# Patient Record
Sex: Female | Born: 1945 | Race: White | Hispanic: No | Marital: Married | Smoking: Former smoker
Health system: Southern US, Community
[De-identification: ages and names within clinical notes are randomized; demographics above are authoritative.]

## PROBLEM LIST (undated history)

## (undated) DIAGNOSIS — K219 Gastro-esophageal reflux disease without esophagitis: Secondary | ICD-10-CM

## (undated) DIAGNOSIS — I6529 Occlusion and stenosis of unspecified carotid artery: Secondary | ICD-10-CM

## (undated) DIAGNOSIS — I10 Essential (primary) hypertension: Secondary | ICD-10-CM

## (undated) HISTORY — PX: TONSILLECTOMY: SUR1361

## (undated) HISTORY — DX: Occlusion and stenosis of unspecified carotid artery: I65.29

## (undated) HISTORY — PX: TUBAL LIGATION: SHX77

## (undated) HISTORY — DX: Gastro-esophageal reflux disease without esophagitis: K21.9

## (undated) HISTORY — DX: Essential (primary) hypertension: I10

---

## 2009-06-23 ENCOUNTER — Ambulatory Visit: Payer: Self-pay | Admitting: Vascular Surgery

## 2009-06-28 ENCOUNTER — Ambulatory Visit: Payer: Self-pay | Admitting: Vascular Surgery

## 2009-07-07 ENCOUNTER — Ambulatory Visit (HOSPITAL_COMMUNITY): Admission: RE | Admit: 2009-07-07 | Discharge: 2009-07-07 | Payer: Self-pay | Admitting: Vascular Surgery

## 2009-07-20 ENCOUNTER — Ambulatory Visit: Payer: Self-pay | Admitting: Vascular Surgery

## 2009-07-21 ENCOUNTER — Encounter: Payer: Self-pay | Admitting: Vascular Surgery

## 2009-07-21 ENCOUNTER — Inpatient Hospital Stay (HOSPITAL_COMMUNITY): Admission: RE | Admit: 2009-07-21 | Discharge: 2009-07-24 | Payer: Self-pay | Admitting: Vascular Surgery

## 2009-07-21 HISTORY — PX: CAROTID ENDARTERECTOMY: SUR193

## 2009-08-10 ENCOUNTER — Ambulatory Visit: Payer: Self-pay | Admitting: Vascular Surgery

## 2009-09-15 ENCOUNTER — Ambulatory Visit: Payer: Self-pay | Admitting: Vascular Surgery

## 2010-02-22 ENCOUNTER — Ambulatory Visit: Payer: Self-pay | Admitting: Vascular Surgery

## 2010-09-15 ENCOUNTER — Ambulatory Visit: Payer: Self-pay | Admitting: Surgery

## 2010-10-03 IMAGING — CR DG CHEST 2V
2 series · 2 of 2 positions shown · non-contrast
Comparison: None

CLINICAL DATA: Preoperative evaluation for right internal carotid
artery stenosis.  Chest and head congestion and hypertension.
Shortness of breath and chest pain.  Smoking history of [DATE] joule
one pack per day.

CHEST - 2 VIEW

[view not recorded (1 of 2)]
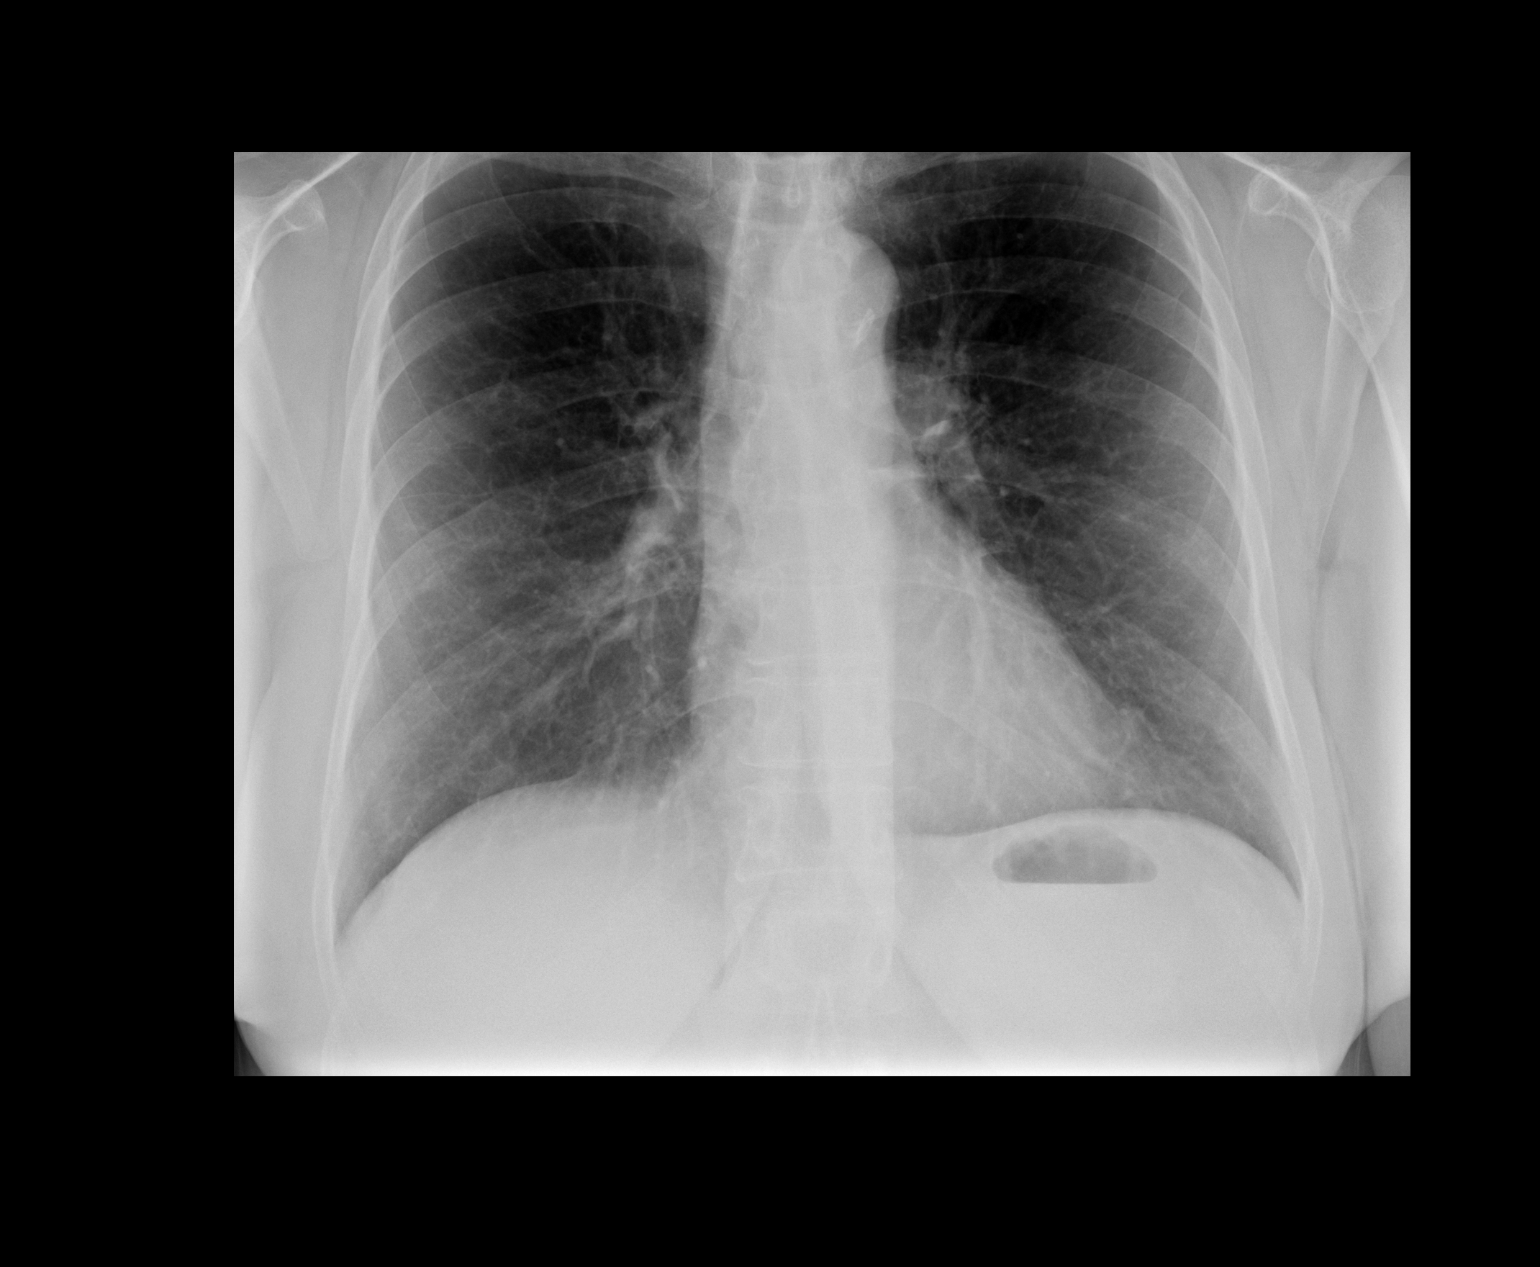

[view not recorded (2 of 2)]
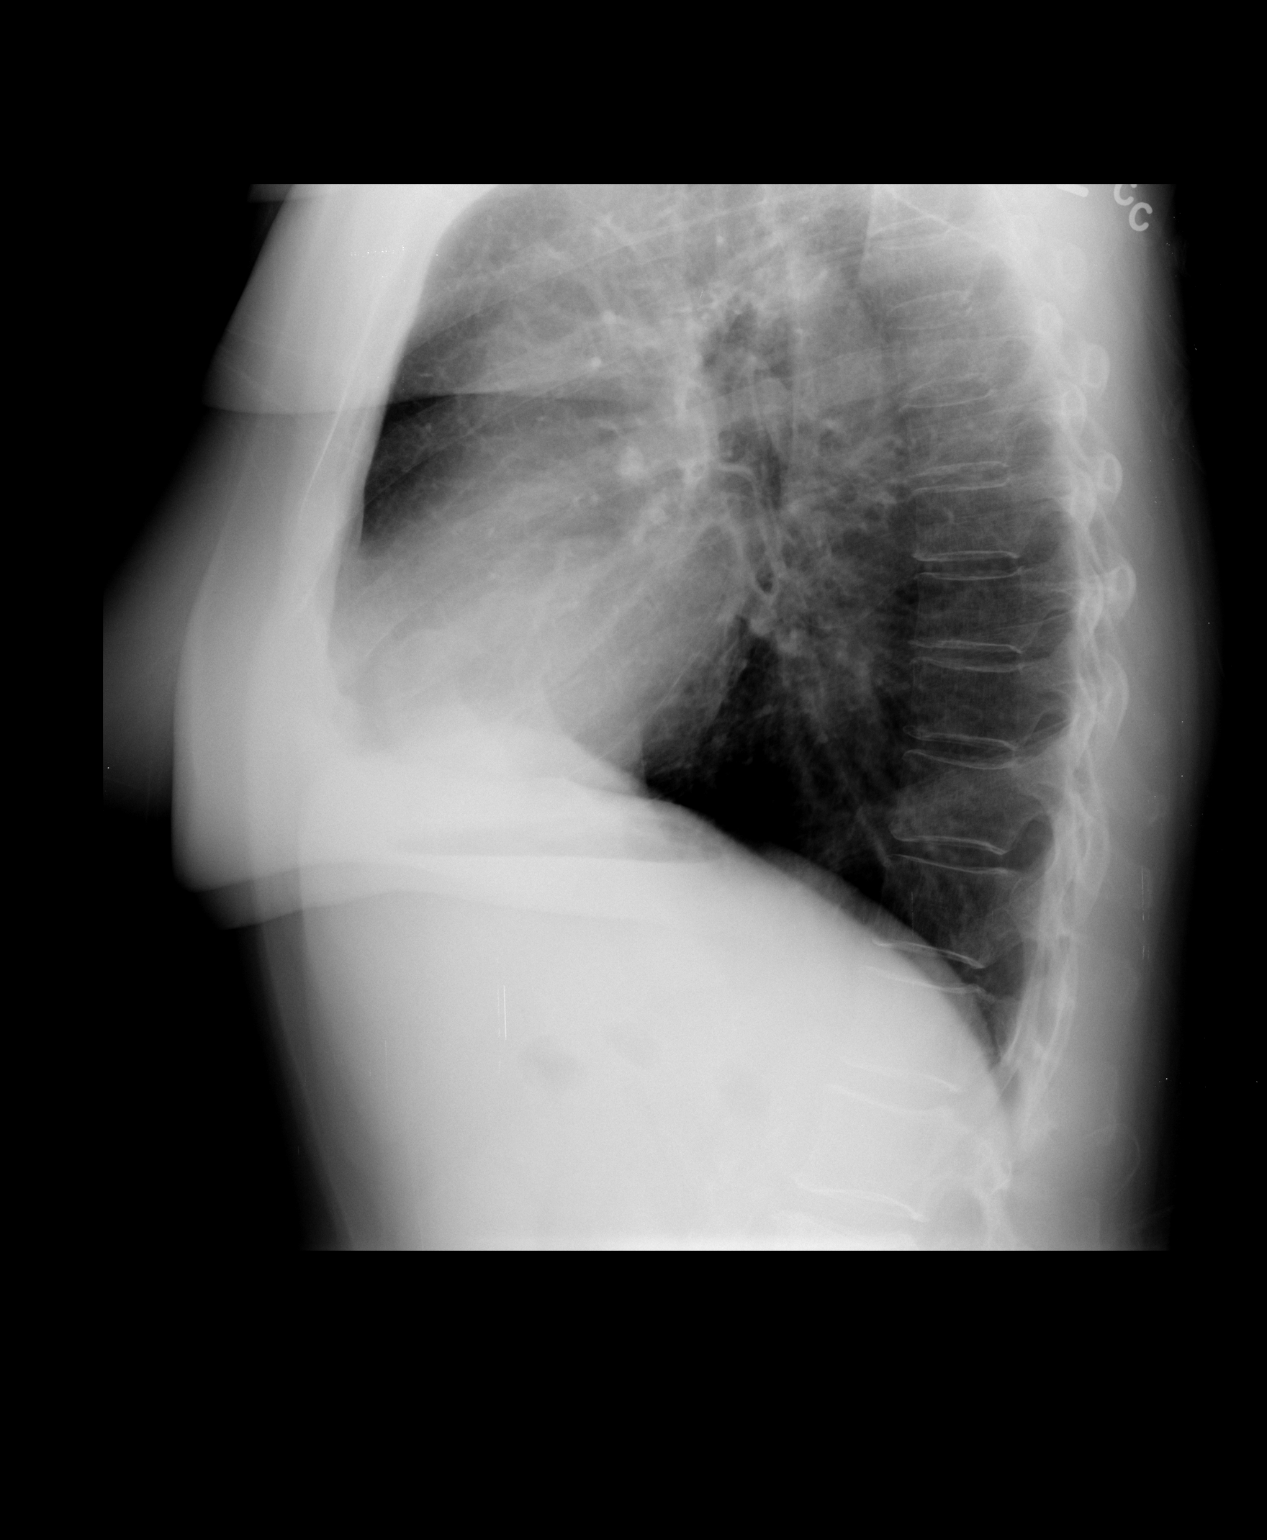

[2 of 2 positions shown; findings below may reference images not displayed]

FINDINGS: Heart and mediastinal contours are within normal limits.
Some diffuse prominence of the interstitial markings are noted
compatible with the patient's history of smoking.  No focal
infiltrates or signs of congestive failure seen.  No pleural fluid
is noted.

There is a small amount of calcification noted in the aortic arch.
Bony structures are intact.
IMPRESSION: No acute cardiopulmonary abnormality noted.

## 2010-10-20 IMAGING — CR DG CHEST 2V
2 series · 2 of 2 positions shown · non-contrast
Comparison: 07/07/2009

CLINICAL DATA: Congestion.  Right ICA stenosis.

CHEST - 2 VIEW

[w chest pa]
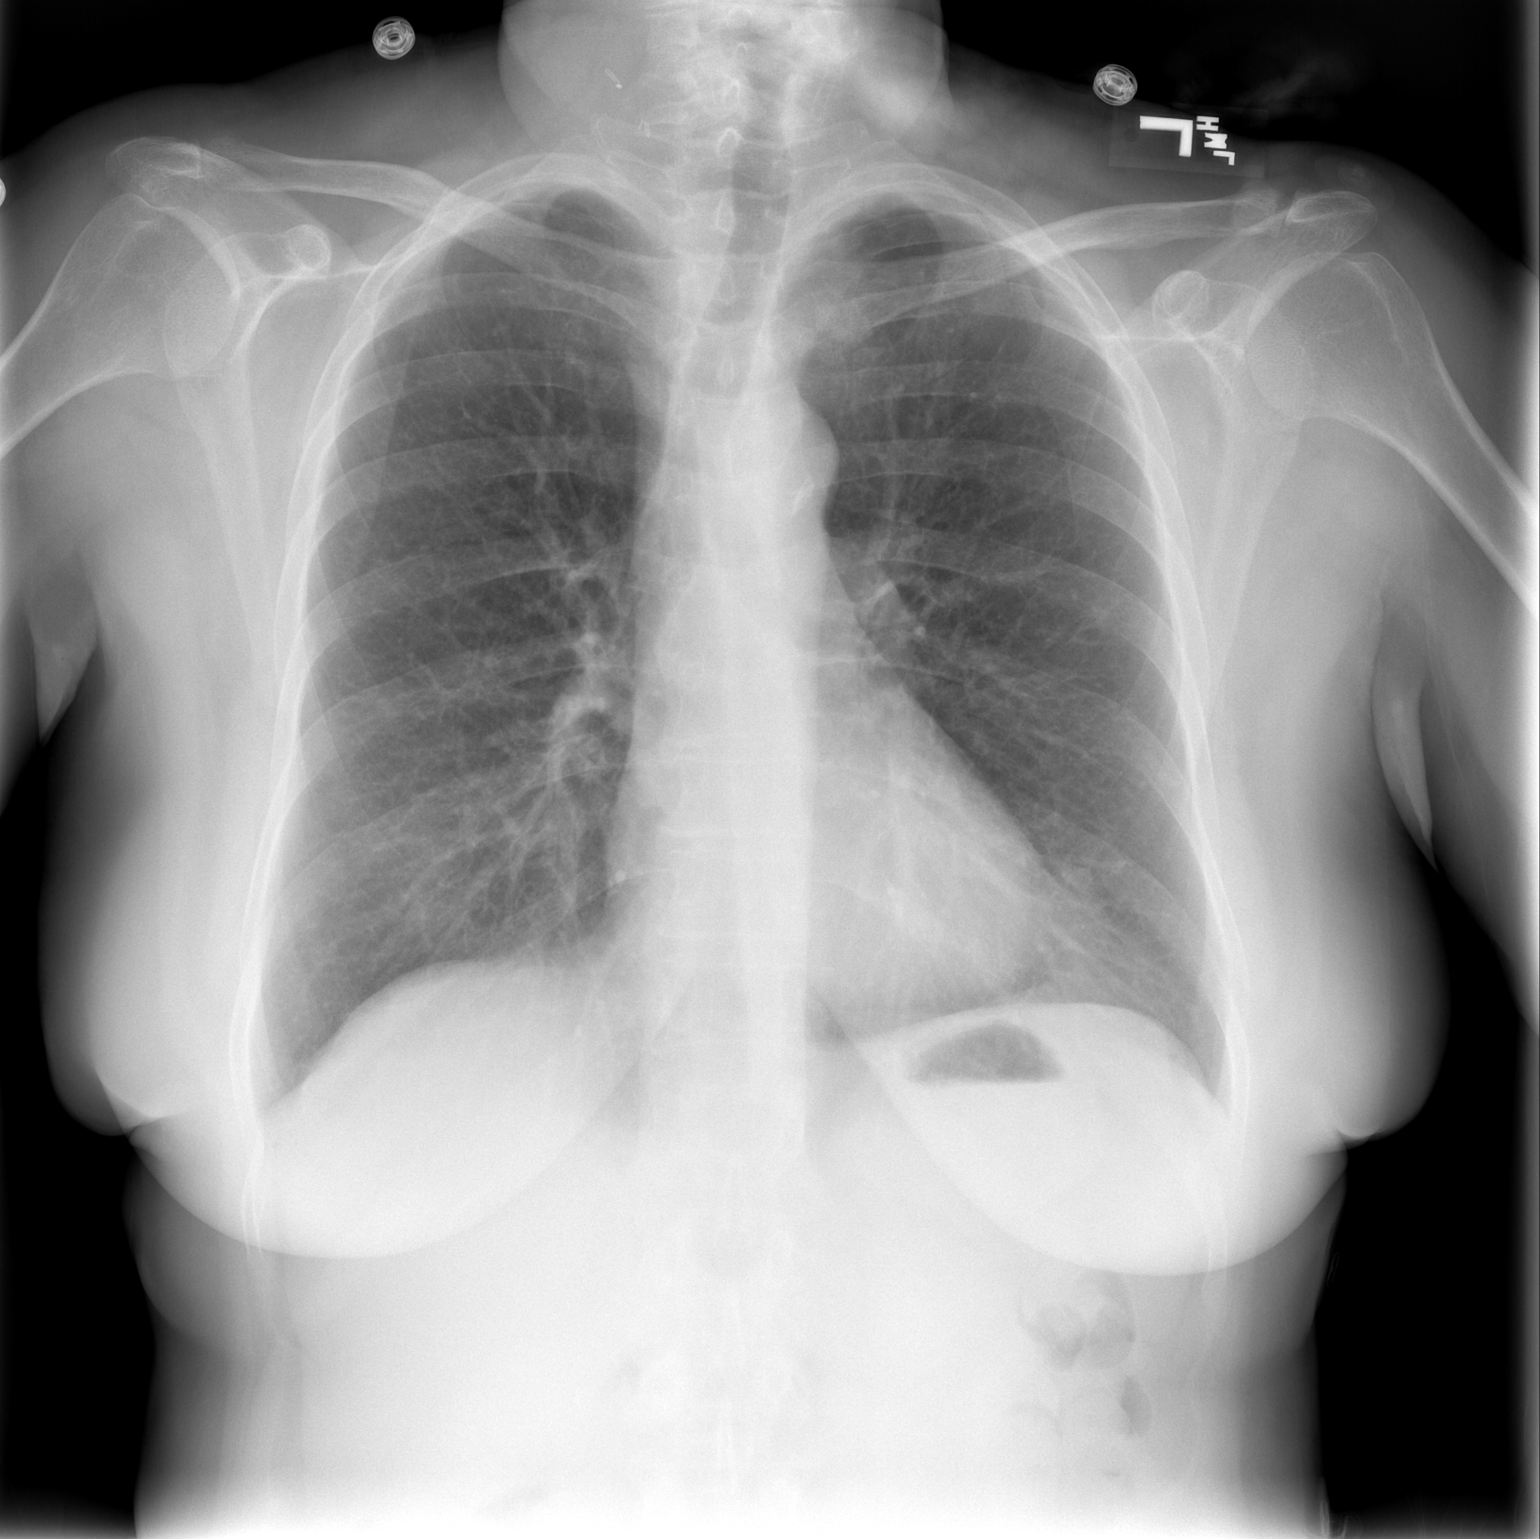

[w chest lat]
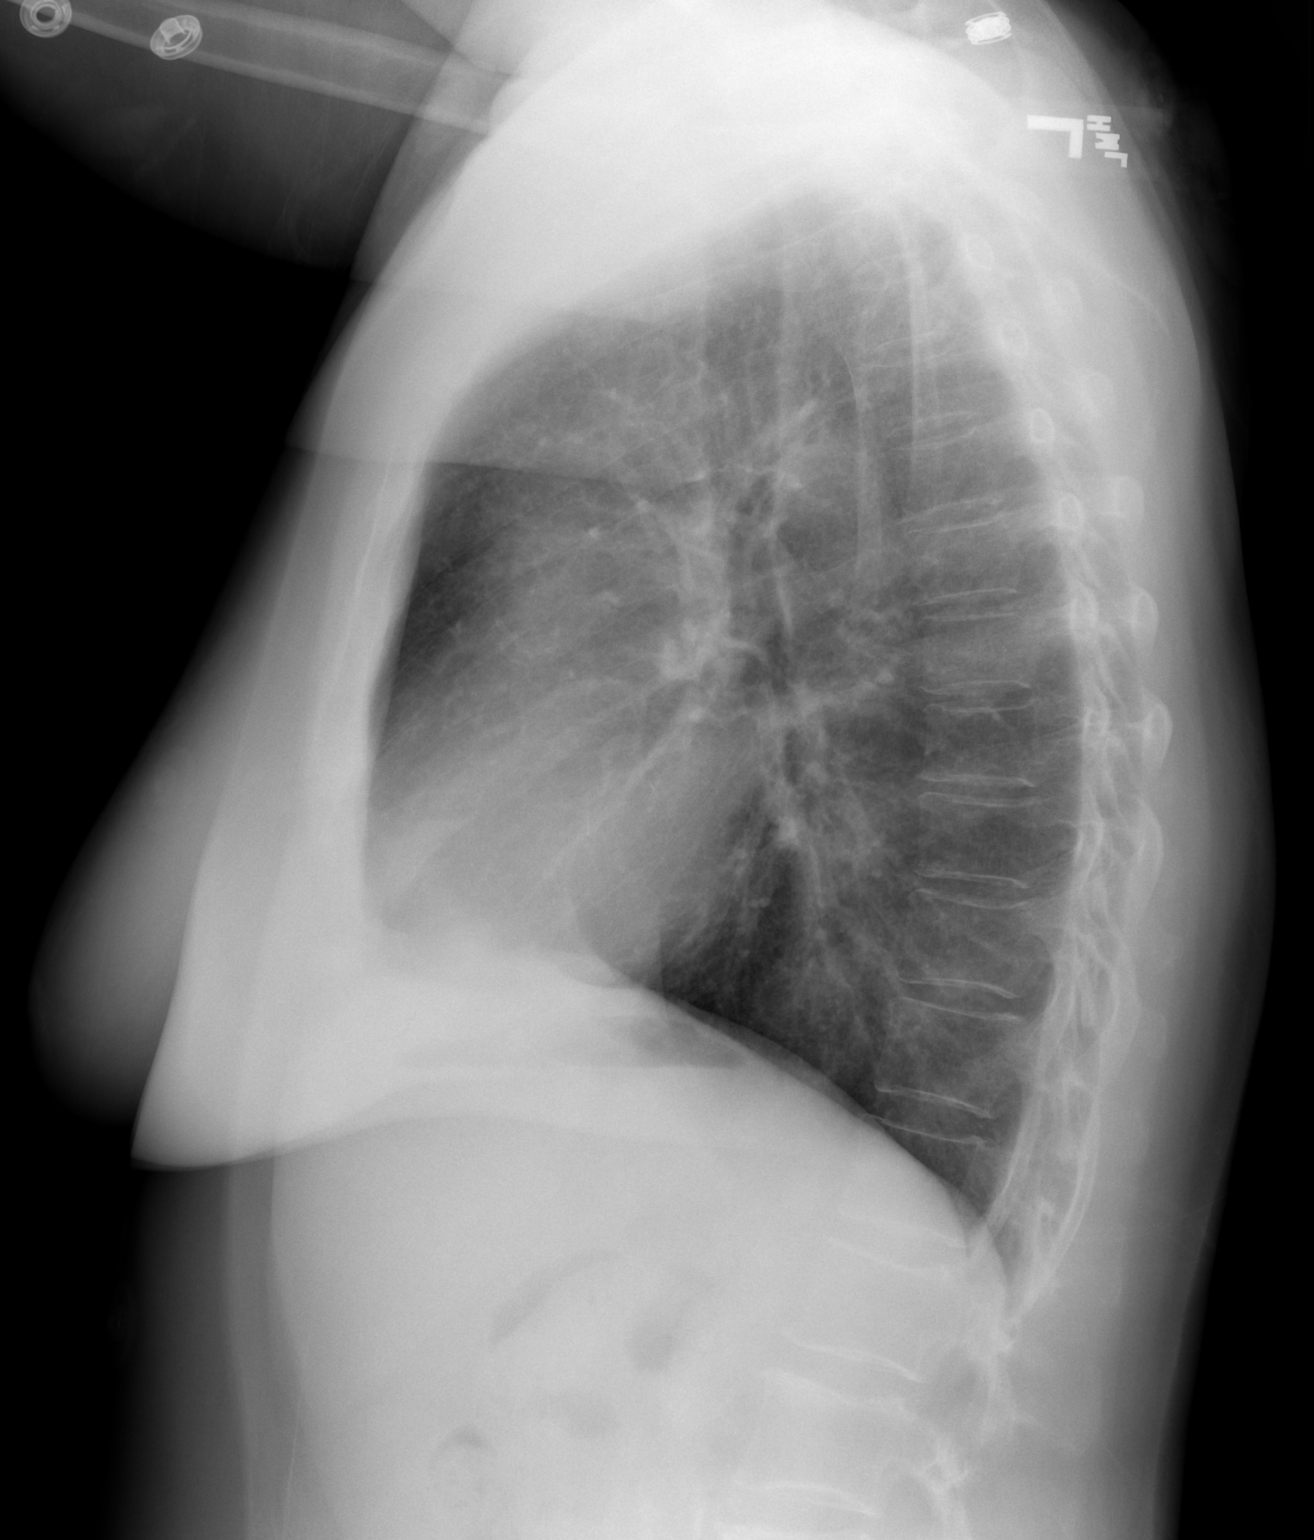

[2 of 2 positions shown; findings below may reference images not displayed]

FINDINGS: Lungs are hyperaerated but clear.  Normal
cardiomediastinal silhouette.  No pulmonary vascular congestion or
edema.
IMPRESSION: COPD.  No acute chest findings.

## 2011-01-12 LAB — COMPREHENSIVE METABOLIC PANEL
ALT: 19 U/L (ref 0–35)
Albumin: 4.4 g/dL (ref 3.5–5.2)
Alkaline Phosphatase: 89 U/L (ref 39–117)
BUN: 10 mg/dL (ref 6–23)
Calcium: 10 mg/dL (ref 8.4–10.5)
Chloride: 101 mEq/L (ref 96–112)
Creatinine, Ser: 0.71 mg/dL (ref 0.4–1.2)
GFR calc Af Amer: >60 mL/min (ref >60)
Sodium: 137 mEq/L (ref 135–145)
Total Bilirubin: 0.3 mg/dL (ref 0.3–1.2)
Total Protein: 8.1 g/dL (ref 6.0–8.3)

## 2011-01-12 LAB — CBC
HCT: 44.4 % (ref 36.0–46.0)
Hemoglobin: 15.3 g/dL — ABNORMAL HIGH (ref 12.0–15.0)
MCHC: 33.8 g/dL (ref 30.0–36.0)
MCV: 93 fL (ref 78.0–100.0)
Platelets: 221 10*3/uL (ref 150–400)
RBC: 4.16 MIL/uL (ref 3.87–5.11)
RDW: 13.1 % (ref 11.5–15.5)
RDW: 13.2 % (ref 11.5–15.5)

## 2011-01-12 LAB — PROTIME-INR
INR: 0.89 (ref 0.00–1.49)
Prothrombin Time: 12 seconds (ref 11.6–15.2)

## 2011-01-12 LAB — URINALYSIS, ROUTINE W REFLEX MICROSCOPIC
Ketones, ur: NEGATIVE mg/dL
Protein, ur: NEGATIVE mg/dL
Specific Gravity, Urine: 1.016 (ref 1.005–1.030)
Urobilinogen, UA: 0.2 mg/dL (ref 0.0–1.0)

## 2011-01-12 LAB — BASIC METABOLIC PANEL
CO2: 26 mEq/L (ref 19–32)
Calcium: 9.8 mg/dL (ref 8.4–10.5)
Creatinine, Ser: 0.65 mg/dL (ref 0.4–1.2)
Sodium: 139 mEq/L (ref 135–145)

## 2011-01-12 LAB — TYPE AND SCREEN
ABO/RH(D): AB POS
Antibody Screen: NEGATIVE

## 2011-01-13 LAB — URINALYSIS, ROUTINE W REFLEX MICROSCOPIC
Ketones, ur: NEGATIVE mg/dL
Nitrite: NEGATIVE
pH: 7 (ref 5.0–8.0)

## 2011-01-13 LAB — CROSSMATCH: Antibody Screen: NEGATIVE

## 2011-01-13 LAB — COMPREHENSIVE METABOLIC PANEL
ALT: 23 U/L (ref 0–35)
Albumin: 4.4 g/dL (ref 3.5–5.2)
Alkaline Phosphatase: 91 U/L (ref 39–117)
CO2: 30 mEq/L (ref 19–32)
Calcium: 10.1 mg/dL (ref 8.4–10.5)
Chloride: 101 mEq/L (ref 96–112)
Creatinine, Ser: 0.98 mg/dL (ref 0.4–1.2)
GFR calc non Af Amer: 58 mL/min — ABNORMAL LOW (ref >60)
Potassium: 4.3 mEq/L (ref 3.5–5.1)
Sodium: 139 mEq/L (ref 135–145)
Total Bilirubin: 0.6 mg/dL (ref 0.3–1.2)

## 2011-01-13 LAB — CBC
HCT: 42.3 % (ref 36.0–46.0)
Hemoglobin: 14.5 g/dL (ref 12.0–15.0)
MCV: 93.2 fL (ref 78.0–100.0)
Platelets: 298 10*3/uL (ref 150–400)
RBC: 4.54 MIL/uL (ref 3.87–5.11)

## 2011-01-13 LAB — APTT: aPTT: 25 seconds (ref 24–37)

## 2011-01-13 LAB — URINE MICROSCOPIC-ADD ON

## 2011-01-13 LAB — PROTIME-INR: Prothrombin Time: 12.3 seconds (ref 11.6–15.2)

## 2011-02-21 NOTE — Assessment & Plan Note (Signed)
OFFICE VISIT   TRIVIA, HEFFELFINGER  DOB:  Jan 13, 1946                                       02/22/2010  [id number]#:[id number]   Date of surgery was 07/21/2009, a right carotid endarterectomy with  Dacron patch angioplasty closure.   REFERRING PHYSICIAN:  Dr. Manus Gunning.   Patient returns to the clinic today for follow-up for her right carotid  endarterectomy.  At this time, she states that she has been doing very  well and has had no symptoms which would be attributable to her disease  process of carotid occlusive disease.  Specifically, she has experienced  no further amaurosis fugax, paresthesias, or hemiparesis.  She states  that she was recently started on metoprolol by Dr. Manus Gunning, and this is  causing her to feel dizzy with some depression and anxiety.  Her  dyslipidemia remained stable.   Family history is noncontributory.   She is married.  She has 3 children and is retired.  She does have a  history of tobacco use, quit on 07/18/2009.   Physical findings revealed a well-nourished woman in no distress.  Heart  rate was 73, blood pressure 188/100, O2 saturation was 97%.  HEENT:  PERRLA, EOMI with normal conjunctiva.  Mucous membranes were pink and  moist.  Lungs were clear to auscultation.  Cardiac exam revealed a  regular rate and rhythm.  I appreciated no murmurs.  Abdomen was soft,  nontender, nondistended.  Musculoskeletal system demonstrated no major  deformities.  Neurological exam was nonfocal.   LABORATORY WORK:  She did undergo carotid ultrasounds.  The right  internal carotid artery was patent, post CEA, with a velocity which  suggested 40% to 59% narrowing in the mid to distal ICA portion past the  surgical site.  There was no evidence of plaque buildup.  The left ICA  velocities suggestive a 60% to 79% narrowing with a moderate amount of  plaque.  This was unchanged from the previous study.  The left external  carotid artery was stenotic.   Vertebral arteries demonstrated antegrade  flows.   ASSESSMENT/PLAN:  Patient has done very well following surgery.  Her  wounds are well-healed.  She is nonfocal in her exam.  She will require  continuing surveillance for her carotids in order to manage her disease.   We will leave the management of her blood pressure medications to Dr.  Manus Gunning.  She does have an appointment with him next week.  She was  informed that she should discuss her dizziness and symptoms of anxiety  and depression, which she believes is caused by her blood pressure  pills, with him.   Wilmon Arms, PA   [name], M.D.  Electronically Signed   KEL/MEDQ  D:  02/22/2010  T:  02/22/2010  Job:  [id number]   cc:   Dr. Manus Gunning

## 2011-02-21 NOTE — H&P (Signed)
HISTORY AND PHYSICAL EXAMINATION   June 28, 2009   Re:  Margaret, Rowe                 DOB:  1946-05-27   CHIEF COMPLAINT:  Severe right internal carotid stenosis, asymptomatic.   HISTORY OF PRESENT ILLNESS:  This 65 year old female patient is being  evaluated by her medical doctor, Dr. Blair Heys, who heard a right  carotid bruit.  She denies any history of stroke, specific neurologic  deficits such as hemiparesis, aphasia, amaurosis fugax, diplopia, but  has had occasional blurred vision or a sensation that something was in  her eyes.  This is not lateralizing, however.  Carotid duplex study was  performed by [facility] on June 16, 2009, which revealed a 90%  right internal carotid stenosis and a 60% left internal carotid  stenosis.  She was referred for further evaluation.   PAST MEDICAL HISTORY:  1. Hypertension, recently diagnosed but not treated yet.  2. Negative for diabetes, hyperlipidemia, coronary artery disease,      COPD, or stroke.   PAST SURGICAL HISTORY:  1. Cesarean section x3.  2. Tonsillectomy.   FAMILY HISTORY:  Positive for coronary artery disease in her  grandparents.  She does not know any medical history about her parents.  Negative for diabetes and stroke.   SOCIAL HISTORY:  She is married and has 3 children.  Works as a  housewife.  She smoked a pack of cigarettes per day for 40+ years.  She  does not use alcohol.   REVIEW OF SYSTEMS:  Denies any chest pain, dyspnea on exertion, COPD,  orthopnea, wheezing, hemoptysis.  No GI or GU symptoms.  No  claudication.  Denies any joint discomfort or neurologic symptoms  otherwise.  Totally negative in all systems.  Please see health history  exam.   ALLERGIES:  Compazine.   MEDICATIONS:  None.   PHYSICAL EXAM:  Blood pressure 182/98, heart rate 100, respirations 14.  Temperature 97.8.  Generally, she is a middle-aged female in no apparent  distress.  Alert and  oriented x3.  Neck is supple, 3+ carotid pulses are  palpable.  There is a soft bruit on the right.  No bruit on the left.  Neurologic exam is normal.  No palpable adenopathy in the neck.  Chest  is clear to auscultation.  Cardiovascular exam is a regular rhythm with  no murmurs.  Abdomen is soft, nontender with no masses.  She has 3+  femoral, popliteal, dorsalis pedis, and posterior tibial pulses  bilaterally.   Carotid duplex exam is repeated in the [facility] office, and confirms a 90%  right internal carotid stenosis and an approximately 60%-70% left  internal carotid stenosis.   IMPRESSION:  Severe right internal carotid stenosis, asymptomatic.   PLAN:  To admit the patient on October 1 for an elective right carotid  endarterectomy.  The risks and benefits have been thoroughly discussed.  The patient has had a recent URI and will see Dr. Manus Gunning today to  determine if antibiotics are necessary as symptoms have been present for  about 1 week.   Margaret Rowe, M.D.  Electronically Signed   JDL/MEDQ  D:  06/28/2009  T:  06/28/2009  Job:  [id number]   cc:   Bryan Lemma. Manus Gunning, M.D.

## 2011-02-21 NOTE — Procedures (Signed)
CAROTID DUPLEX EXAM   INDICATION:  Right carotid endarterectomy, follow up known carotid  disease.   HISTORY:  Diabetes:  No.  Cardiac:  No.  Hypertension:  Yes.  Smoking:  Quit in October 2010.  Previous Surgery:  Right CEA done on 07/21/09 by Dr. Hart Rochester.  CV History:  None.  Amaurosis Fugax No, Paresthesias No, Hemiparesis No.                                       RIGHT             LEFT  Brachial systolic pressure:         170               168  Brachial Doppler waveforms:         Triphasic         Triphasic  Vertebral direction of flow:        Antegrade         Antegrade  DUPLEX VELOCITIES (cm/sec)  CCA peak systolic                   91                79  ECA peak systolic                   235               246  ICA peak systolic                   142               249  ICA end diastolic                   45                70  PLAQUE MORPHOLOGY:                  Mixed             Calcific  PLAQUE AMOUNT:                      Moderate          Moderate  PLAQUE LOCATION:                    ECA               ICA, ECA,  bifurcation   IMPRESSION:  1. Right internal carotid artery is patent, post carotid      endarterectomy with the velocity suggesting 40% to 59% in the mid      to distal internal carotid artery past the surgery site.  No      evidence of plaque buildup.  2. Left internal carotid artery velocities suggest 60% to 79% stenosis      with a moderate amount of plaque.  3. Left external carotid artery is stenotic.  4. Vertebral arteries show antegrade flow.   ___________________________________________  [name], M.D.   NT/MEDQ  D:  02/22/2010  T:  02/22/2010  Job:  [id number]

## 2011-02-21 NOTE — Assessment & Plan Note (Signed)
OFFICE VISIT   Margaret Rowe, Margaret Rowe  DOB:  28-Dec-1945                                       08/10/2009  [id number]#:[id number]   The patient returns 2 weeks post right carotid endarterectomy for severe  but asymptomatic right internal carotid stenosis.  She has done well  since her surgery with no specific neurologic complications or  complaints other than one transient episode of some numbness in the left  upper extremity lasting a few seconds.  She is not certain if this was  positional or not.  She is not taking aspirin.  She has had no  hoarseness or difficulty swallowing and has no specific complaints.   PHYSICAL EXAMINATION:  Blood pressure 166/82, heart rate 89,  respirations 14.  Right neck incision is well-healed.  Carotid bruit is  soft on the left and not audible on the right.  Neurologic exam is  normal.   I am very pleased with her early result and encouraged her to begin  taking aspirin 81 mg per day.  She will follow up with Dr. Manus Gunning and  return to see [place] in 6 months for carotid duplex exam unless she has any  symptoms in the interim.   [name], M.D.  Electronically Signed   JDL/MEDQ  D:  08/10/2009  T:  08/11/2009  Job:  [id number]   cc:   Bryan Lemma. Manus Gunning, M.D.

## 2011-02-21 NOTE — Procedures (Signed)
CAROTID DUPLEX EXAM   INDICATION:  Carotid stenosis / bruit.   HISTORY:  Diabetes:  No.  Cardiac:  No.  Hypertension:  Process of testing.  Smoking:  Yes.  Previous Surgery:  No.  CV History:  No.  Amaurosis Fugax No, Paresthesias No, Hemiparesis No                                       RIGHT               LEFT  Brachial systolic pressure:         175                 178  Brachial Doppler waveforms:         WNL                 WNL  Vertebral direction of flow:        Antegrade           Antegrade  DUPLEX VELOCITIES (cm/sec)  CCA peak systolic                   83                  102  ECA peak systolic                   151                 270  ICA peak systolic                   471                 223  ICA end diastolic                   124                 58  PLAQUE MORPHOLOGY:                  Mixed               Calcific  PLAQUE AMOUNT:                      Severe              Moderate  PLAQUE LOCATION:                    ICA/ECA/bifurcation  ICA/ECA/bifurcation   IMPRESSION:  1. Right internal carotid artery shows evidence of 80-99% stenosis.  2. Left internal carotid artery shows evidence of 60-79% stenosis (low      end of range).  3. Left external carotid artery stenosis.  4. Followup with Dr. Hart Rochester scheduled for 06/28/2009.   ___________________________________________  Quita Skye. Hart Rochester, M.D.   AS/MEDQ  D:  06/23/2009  T:  06/23/2009  Job:  [id number]

## 2012-05-15 ENCOUNTER — Encounter: Payer: Self-pay | Admitting: Vascular Surgery

## 2013-01-03 ENCOUNTER — Other Ambulatory Visit: Payer: Self-pay | Admitting: Family Medicine

## 2013-01-03 ENCOUNTER — Other Ambulatory Visit: Payer: Self-pay

## 2013-01-03 ENCOUNTER — Encounter: Payer: Self-pay | Admitting: Vascular Surgery

## 2013-01-03 DIAGNOSIS — N951 Menopausal and female climacteric states: Secondary | ICD-10-CM

## 2013-01-03 DIAGNOSIS — Z1231 Encounter for screening mammogram for malignant neoplasm of breast: Secondary | ICD-10-CM

## 2013-02-13 ENCOUNTER — Ambulatory Visit
Admission: RE | Admit: 2013-02-13 | Discharge: 2013-02-13 | Disposition: A | Discharge status: Home / Self Care | Payer: Medicare HMO | Source: Ambulatory Visit | Attending: Family Medicine | Admitting: Family Medicine

## 2013-02-13 DIAGNOSIS — Z1231 Encounter for screening mammogram for malignant neoplasm of breast: Secondary | ICD-10-CM

## 2013-02-13 DIAGNOSIS — N951 Menopausal and female climacteric states: Secondary | ICD-10-CM

## 2013-03-18 ENCOUNTER — Ambulatory Visit: Payer: Self-pay | Admitting: Vascular Surgery

## 2013-03-18 ENCOUNTER — Other Ambulatory Visit: Payer: Self-pay

## 2013-06-02 ENCOUNTER — Encounter: Payer: Self-pay | Admitting: Vascular Surgery

## 2013-06-03 ENCOUNTER — Ambulatory Visit: Payer: Self-pay | Admitting: Vascular Surgery

## 2013-06-03 ENCOUNTER — Other Ambulatory Visit: Payer: Self-pay

## 2013-07-21 ENCOUNTER — Encounter: Payer: Self-pay | Admitting: Vascular Surgery

## 2013-07-22 ENCOUNTER — Encounter (INDEPENDENT_AMBULATORY_CARE_PROVIDER_SITE_OTHER): Payer: Self-pay

## 2013-07-22 ENCOUNTER — Ambulatory Visit (INDEPENDENT_AMBULATORY_CARE_PROVIDER_SITE_OTHER): Payer: Medicare HMO | Admitting: Vascular Surgery

## 2013-07-22 ENCOUNTER — Encounter: Payer: Self-pay | Admitting: Vascular Surgery

## 2013-07-22 ENCOUNTER — Ambulatory Visit (HOSPITAL_COMMUNITY)
Admission: RE | Admit: 2013-07-22 | Discharge: 2013-07-22 | Disposition: A | Discharge status: Home / Self Care | Payer: Medicare HMO | Source: Ambulatory Visit | Attending: Vascular Surgery | Admitting: Vascular Surgery

## 2013-07-22 DIAGNOSIS — I6529 Occlusion and stenosis of unspecified carotid artery: Secondary | ICD-10-CM | POA: Insufficient documentation

## 2013-07-22 DIAGNOSIS — Z48812 Encounter for surgical aftercare following surgery on the circulatory system: Secondary | ICD-10-CM

## 2013-07-22 NOTE — Progress Notes (Signed)
Subjective:     Patient ID: Margaret Rowe, female   DOB: 1946/03/15, 67 y.o.   MRN: [id number]  HPI this 67 year old female returns for continued followup regarding her carotid occlusive disease. She underwent right carotid endarterectomy in 2010. Since that time she has had no neurologic symptoms. She does take an 81 mg aspirin tablet on most days but not every day. She's had no history of coronary artery disease or stroke. She denies lateralizing weakness, aphasia, amaurosis fugax, diplopia, blurred vision, or syncope.  Past Medical History  Diagnosis Date  . Hypertension   . GERD (gastroesophageal reflux disease)   . Carotid artery occlusion     History  Substance Use Topics  . Smoking status: Former Smoker    Types: Cigarettes    Quit date: 07/23/2011  . Smokeless tobacco: Never Used  . Alcohol Use: No    History reviewed. No pertinent family history.  Allergies  Allergen Reactions  . Augmentin [Amoxicillin-Pot Clavulanate] Diarrhea  . Codeine   . Compazine [Prochlorperazine Edisylate]     Stroke like sx's  . Demerol [Meperidine]   . Erythromycin     GI upset  . Lisinopril Cough    Fatigue, depression  . Toprol Xl [Metoprolol Tartrate]     Fatigue, depression    Current outpatient prescriptions:amLODipine (NORVASC) 5 MG tablet, Take 5 mg by mouth daily., Disp: , Rfl: ;  aspirin 81 MG tablet, Take 81 mg by mouth daily., Disp: , Rfl: ;  Omega-3 Fatty Acids (FISH OIL) 1000 MG CAPS, Take 1 capsule by mouth., Disp: , Rfl: ;  oxybutynin (DITROPAN-XL) 5 MG 24 hr tablet, Take 1 tablet by mouth daily., Disp: , Rfl: ;  pantoprazole (PROTONIX) 40 MG tablet, Take 40 mg by mouth daily., Disp: , Rfl:  predniSONE (DELTASONE) 20 MG tablet, Take 20 mg by mouth daily., Disp: , Rfl: ;  PREMARIN vaginal cream, Place 1 Applicatorful vaginally daily., Disp: , Rfl:   BP 183/90  Pulse 94  Wt 165 lb 6.4 oz (75.025 kg)  SpO2 98%  There is no height on file to calculate  BMI.           Review of Systems denies chest pain, dyspnea on exertion, PND, orthopnea, hemoptysis. Smokes-electronic cigarettes but no nicotine occasional. All other systems negative complete review of systems     Objective:   Physical Exam BP 183/90  Pulse 94  Wt 165 lb 6.4 oz (75.025 kg)  SpO2 98%  Gen.-alert and oriented x3 in no apparent distress HEENT normal for age Lungs no rhonchi or wheezing Cardiovascular regular rhythm no murmurs carotid pulses 3+ palpable-harsh bruit on the left Abdomen soft nontender no palpable masses Musculoskeletal free of  major deformities Skin clear -no rashes Neurologic normal Lower extremities 3+ femoral and dorsalis pedis pulses palpable bilaterally with no edema  Today I ordered a carotid duplex exam which I reviewed and interpreted. The right ICA at the endarterectomy site is widely patent. The left internal carotid artery has an approximate 70% stenosis at the origin and there is a severe left external carotid stenosis. This is slightly worse than previous studies       Assessment:     Moderate left ICA stenosis-70%-asymptomatic-status post right carotid endarterectomy in 2010    Plan:     Return in one year for carotid duplex exam and see nurse practitioner unless patient develops neurologic symptoms in the interim Take 81 mg aspirin tablet each day

## 2013-07-22 NOTE — Addendum Note (Signed)
Addended by: Sharee Pimple on: 07/22/2013 11:34 AM   Modules accepted: Orders

## 2013-08-14 ENCOUNTER — Other Ambulatory Visit: Payer: Self-pay

## 2014-07-29 ENCOUNTER — Other Ambulatory Visit (HOSPITAL_COMMUNITY): Payer: Medicare HMO

## 2014-07-29 ENCOUNTER — Ambulatory Visit: Payer: Medicare HMO | Admitting: Family

## 2014-09-08 ENCOUNTER — Encounter: Payer: Self-pay | Admitting: Family

## 2014-09-09 ENCOUNTER — Other Ambulatory Visit (HOSPITAL_COMMUNITY): Payer: Commercial Managed Care - HMO

## 2014-09-09 ENCOUNTER — Ambulatory Visit: Payer: Self-pay | Admitting: Family

## 2014-10-28 ENCOUNTER — Encounter: Payer: Self-pay | Admitting: Family

## 2014-10-29 ENCOUNTER — Ambulatory Visit: Payer: Self-pay | Admitting: Family

## 2014-10-29 ENCOUNTER — Other Ambulatory Visit (HOSPITAL_COMMUNITY): Payer: Commercial Managed Care - HMO

## 2014-11-18 ENCOUNTER — Ambulatory Visit: Payer: Self-pay | Admitting: Family

## 2014-11-18 ENCOUNTER — Other Ambulatory Visit (HOSPITAL_COMMUNITY): Payer: Commercial Managed Care - HMO

## 2014-11-20 ENCOUNTER — Encounter: Payer: Self-pay | Admitting: Family

## 2014-11-24 ENCOUNTER — Other Ambulatory Visit (HOSPITAL_COMMUNITY): Payer: Commercial Managed Care - HMO

## 2014-11-24 ENCOUNTER — Ambulatory Visit: Payer: Self-pay | Admitting: Family

## 2014-12-01 ENCOUNTER — Encounter: Payer: Self-pay | Admitting: Family

## 2014-12-02 ENCOUNTER — Ambulatory Visit (INDEPENDENT_AMBULATORY_CARE_PROVIDER_SITE_OTHER): Payer: Commercial Managed Care - HMO | Admitting: Family

## 2014-12-02 ENCOUNTER — Ambulatory Visit (HOSPITAL_COMMUNITY)
Admission: RE | Admit: 2014-12-02 | Discharge: 2014-12-02 | Disposition: A | Discharge status: Home / Self Care | Payer: Commercial Managed Care - HMO | Source: Ambulatory Visit | Attending: Family | Admitting: Family

## 2014-12-02 ENCOUNTER — Encounter: Payer: Self-pay | Admitting: Family

## 2014-12-02 VITALS — BP 153/93 | HR 79 | Resp 16 | Ht 60.05 in | Wt 166.0 lb

## 2014-12-02 DIAGNOSIS — Z87891 Personal history of nicotine dependence: Secondary | ICD-10-CM | POA: Diagnosis not present

## 2014-12-02 DIAGNOSIS — I6529 Occlusion and stenosis of unspecified carotid artery: Secondary | ICD-10-CM | POA: Diagnosis not present

## 2014-12-02 DIAGNOSIS — Z9889 Other specified postprocedural states: Secondary | ICD-10-CM

## 2014-12-02 DIAGNOSIS — Z48812 Encounter for surgical aftercare following surgery on the circulatory system: Secondary | ICD-10-CM | POA: Diagnosis not present

## 2014-12-02 DIAGNOSIS — F17200 Nicotine dependence, unspecified, uncomplicated: Secondary | ICD-10-CM | POA: Diagnosis not present

## 2014-12-02 DIAGNOSIS — I6522 Occlusion and stenosis of left carotid artery: Secondary | ICD-10-CM | POA: Insufficient documentation

## 2014-12-02 NOTE — Patient Instructions (Signed)
Stroke Prevention Some medical conditions and behaviors are associated with an increased chance of having a stroke. You may prevent a stroke by making healthy choices and managing medical conditions. HOW CAN I REDUCE MY RISK OF HAVING A STROKE?   Stay physically active. Get at least 30 minutes of activity on most or all days.  Do not smoke. It may also be helpful to avoid exposure to secondhand smoke.  Limit alcohol use. Moderate alcohol use is considered to be:  No more than 2 drinks per day for men.  No more than 1 drink per day for nonpregnant women.  Eat healthy foods. This involves:  Eating 5 or more servings of fruits and vegetables a day.  Making dietary changes that address high blood pressure (hypertension), high cholesterol, diabetes, or obesity.  Manage your cholesterol levels.  Making food choices that are high in fiber and low in saturated fat, trans fat, and cholesterol may control cholesterol levels.  Take any prescribed medicines to control cholesterol as directed by your health care provider.  Manage your diabetes.  Controlling your carbohydrate and sugar intake is recommended to manage diabetes.  Take any prescribed medicines to control diabetes as directed by your health care provider.  Control your hypertension.  Making food choices that are low in salt (sodium), saturated fat, trans fat, and cholesterol is recommended to manage hypertension.  Take any prescribed medicines to control hypertension as directed by your health care provider.  Maintain a healthy weight.  Reducing calorie intake and making food choices that are low in sodium, saturated fat, trans fat, and cholesterol are recommended to manage weight.  Stop drug abuse.  Avoid taking birth control pills.  Talk to your health care provider about the risks of taking birth control pills if you are over 35 years old, smoke, get migraines, or have ever had a blood clot.  Get evaluated for sleep  disorders (sleep apnea).  Talk to your health care provider about getting a sleep evaluation if you snore a lot or have excessive sleepiness.  Take medicines only as directed by your health care provider.  For some people, aspirin or blood thinners (anticoagulants) are helpful in reducing the risk of forming abnormal blood clots that can lead to stroke. If you have the irregular heart rhythm of atrial fibrillation, you should be on a blood thinner unless there is a good reason you cannot take them.  Understand all your medicine instructions.  Make sure that other conditions (such as anemia or atherosclerosis) are addressed. SEEK IMMEDIATE MEDICAL CARE IF:   You have sudden weakness or numbness of the face, arm, or leg, especially on one side of the body.  Your face or eyelid droops to one side.  You have sudden confusion.  You have trouble speaking (aphasia) or understanding.  You have sudden trouble seeing in one or both eyes.  You have sudden trouble walking.  You have dizziness.  You have a loss of balance or coordination.  You have a sudden, severe headache with no known cause.  You have new chest pain or an irregular heartbeat. Any of these symptoms may represent a serious problem that is an emergency. Do not wait to see if the symptoms will go away. Get medical help at once. Call your local emergency services (911 in [place] Do not drive yourself to the hospital. Document Released: 11/02/2004 Document Revised: 02/09/2014 Document Reviewed: 03/28/2013 [employer] Patient Information 2015 [employer]. This information is not intended to replace advice given   to you by your health care provider. Make sure you discuss any questions you have with your health care provider.  

## 2014-12-02 NOTE — Progress Notes (Signed)
Established Carotid Patient   History of Present Illness  Margaret Rowe is a 69 y.o. female patient of Dr. Hart RochesterLawson returns for continued follow up. She is s/p right carotid endarterectomy in 2010.   She does take an 81 mg aspirin tablet on most days but not every day. She's had no history of coronary artery disease.  The patient denies any history of TIA or stroke symptoms, specifically the patient denies a history of amaurosis fugax or monocular blindness, denies a history unilateral  of facial drooping, denies a history of hemiplegia, and denies a history of receptive or expressive aphasia.    The patient denies New Medical or Surgical History.  Pt denies claudication symptoms with walking, denies non healing wounds.  Pt Diabetic: No Pt smoker: former smoker, quit in 2012  Pt meds include: Statin : No: she stopped taking, states she will tell her PCP, due to feeling like she is choking and spacey ASA: Yes Other anticoagulants/antiplatelets: no   Past Medical History  Diagnosis Date  . Hypertension   . GERD (gastroesophageal reflux disease)   . Carotid artery occlusion     Social History History  Substance Use Topics  . Smoking status: Former Smoker    Types: Cigarettes    Quit date: 07/23/2011  . Smokeless tobacco: Never Used  . Alcohol Use: No    Family History Family History  Problem Relation Age of Onset  . Diabetes Sister     Surgical History Past Surgical History  Procedure Laterality Date  . Tubal ligation    . Cesarean section      X2  . Tonsillectomy    . Carotid endarterectomy Right Oct. 13, 2010    CE  Dr. Kipp BroodJDL    Allergies  Allergen Reactions  . Augmentin [Amoxicillin-Pot Clavulanate] Diarrhea  . Codeine   . Compazine [Prochlorperazine Edisylate]     Stroke like sx's  . Demerol [Meperidine]   . Erythromycin     GI upset  . Lisinopril Cough    Fatigue, depression  . Toprol Xl [Metoprolol Tartrate]     Fatigue, depression    Current  Outpatient Prescriptions  Medication Sig Dispense Refill  . amLODipine (NORVASC) 5 MG tablet Take 5 mg by mouth daily.    Marland Kitchen. aspirin 81 MG tablet Take 81 mg by mouth daily.    . Omega-3 Fatty Acids (FISH OIL) 1000 MG CAPS Take 1 capsule by mouth.    . oxybutynin (DITROPAN-XL) 5 MG 24 hr tablet Take 1 tablet by mouth daily.    . pantoprazole (PROTONIX) 40 MG tablet Take 40 mg by mouth daily.    . predniSONE (DELTASONE) 20 MG tablet Take 20 mg by mouth daily.    Marland Kitchen. PREMARIN vaginal cream Place 1 Applicatorful vaginally daily.     No current facility-administered medications for this visit.    Review of Systems : See HPI for pertinent positives and negatives.  Physical Examination  Filed Vitals:   12/02/14 1317 12/02/14 1321  BP: 168/81 153/93  Pulse: 85 79  Resp:  16  Height:  5' 0.05" (1.525 m)  Weight:  166 lb (75.297 kg)  SpO2:  96%   Body mass index is 32.38 kg/(m^2).  General: WDWN obese female in NAD GAIT: normal Eyes: PERRLA Pulmonary:  Non-labored, CTAB, Negative  Rales, Negative rhonchi, & Negative wheezing.  Cardiac: regular Rhythm,  Positive detected murmur.  VASCULAR EXAM Carotid Bruits Right Left   Negative Positive    Aorta is not palpable.  Radial pulses are 2+ palpable and equal.                                                                                                                            LE Pulses Right Left       POPLITEAL  not palpable   not palpable       POSTERIOR TIBIAL  not palpable   not palpable        DORSALIS PEDIS      ANTERIOR TIBIAL  palpable   palpable     Gastrointestinal: soft, nontender, BS WNL, no r/g,  negative palpated masses.  Musculoskeletal: Negative muscle atrophy/wasting. M/S 5/5 throughout, Extremities without ischemic changes.  Neurologic: A&O X 3; Appropriate Affect,  Speech is normal CN 2-12 intact, Pain and light touch intact in extremities, Motor exam as listed above.   Non-Invasive Vascular  Imaging CAROTID DUPLEX 12/02/2014   CEREBROVASCULAR DUPLEX EVALUATION    INDICATION: Carotid artery disease    PREVIOUS INTERVENTION(S): Right carotid endarterectomy 07/21/2009    DUPLEX EXAM: Carotid duplex    RIGHT  LEFT  Peak Systolic Velocities (cm/s) End Diastolic Velocities (cm/s) Plaque LOCATION Peak Systolic Velocities (cm/s) End Diastolic Velocities (cm/s) Plaque  134 22 - CCA PROXIMAL 88 29 HT  99 19 - CCA MID 84 29 HT  125 28 HM CCA DISTAL 73 24 HM  171 33 - ECA 556 32 CP  100 24 - ICA PROXIMAL 269 80 CP  121 38 - ICA MID 209 58 -  100 35 - ICA DISTAL 115 31 -    N/A ICA / CCA Ratio (PSV) 3.2  Antegrade Vertebral Flow Antegrade  170 Brachial Systolic Pressure (mmHg) 170  Triphasic Brachial Artery Waveforms Triphasic    Plaque Morphology:  HM = Homogeneous, HT = Heterogeneous, CP = Calcific Plaque, SP = Smooth Plaque, IP = Irregular Plaque     ADDITIONAL FINDINGS:     IMPRESSION: 1. Patent right carotid endarterectomy site with no evidence for restenosis. 2. 60 - 79% left internal carotid artery stenosis, calcific plaque may obscure higher velocity. 3. Left external carotid artery stenosis.    Compared to the previous exam:  No significant change      Assessment: Margaret Rowe is a 69 y.o. female who is s/p right carotid endarterectomy in 2010. Today's carotid Duplex reveals a patent right carotid endarterectomy site with no evidence for restenosis and 60 - 79% left internal carotid artery stenosis, calcific plaque may obscure higher velocity. The  ICA stenosis is  Unchanged from previous exam.  Plan: Follow-up in 6 months with Carotid Duplex.   I discussed in depth with the patient the nature of atherosclerosis, and emphasized the importance of maximal medical management including strict control of blood pressure, blood glucose, and lipid levels, obtaining regular exercise, and continued cessation of smoking.  The patient is aware that without maximal  medical management the underlying atherosclerotic disease process will progress, limiting the  benefit of any interventions. The patient was given information about stroke prevention and what symptoms should prompt the patient to seek immediate medical care. Thank you for allowing [place] to participate in this patient's care.  Charisse March, RN, MSN, FNP-C [facility] Office: [phone]  Clinic Physician: Hart Rochester on call  12/02/2014 1:23 PM

## 2014-12-03 NOTE — Addendum Note (Signed)
Addended by: Lorin MercyMCCHESNEY, Terrill Wauters K on: 12/03/2014 02:00 PM   Modules accepted: Orders

## 2015-06-22 ENCOUNTER — Ambulatory Visit: Payer: Commercial Managed Care - HMO | Admitting: Family

## 2015-06-22 ENCOUNTER — Encounter (HOSPITAL_COMMUNITY): Payer: Commercial Managed Care - HMO

## 2016-11-28 DIAGNOSIS — Z1389 Encounter for screening for other disorder: Secondary | ICD-10-CM | POA: Diagnosis not present

## 2016-11-28 DIAGNOSIS — R030 Elevated blood-pressure reading, without diagnosis of hypertension: Secondary | ICD-10-CM | POA: Diagnosis not present

## 2016-11-28 DIAGNOSIS — Z9181 History of falling: Secondary | ICD-10-CM | POA: Diagnosis not present

## 2016-11-28 DIAGNOSIS — J329 Chronic sinusitis, unspecified: Secondary | ICD-10-CM | POA: Diagnosis not present

## 2017-05-24 DIAGNOSIS — J22 Unspecified acute lower respiratory infection: Secondary | ICD-10-CM | POA: Diagnosis not present

## 2017-05-24 DIAGNOSIS — H6692 Otitis media, unspecified, left ear: Secondary | ICD-10-CM | POA: Diagnosis not present

## 2017-06-07 DIAGNOSIS — J22 Unspecified acute lower respiratory infection: Secondary | ICD-10-CM | POA: Diagnosis not present

## 2017-06-07 DIAGNOSIS — H6692 Otitis media, unspecified, left ear: Secondary | ICD-10-CM | POA: Diagnosis not present

## 2017-06-07 DIAGNOSIS — J9811 Atelectasis: Secondary | ICD-10-CM | POA: Diagnosis not present

## 2017-06-14 DIAGNOSIS — H6692 Otitis media, unspecified, left ear: Secondary | ICD-10-CM | POA: Diagnosis not present

## 2017-06-14 DIAGNOSIS — I10 Essential (primary) hypertension: Secondary | ICD-10-CM | POA: Diagnosis not present

## 2017-06-14 DIAGNOSIS — J22 Unspecified acute lower respiratory infection: Secondary | ICD-10-CM | POA: Diagnosis not present

## 2017-06-14 DIAGNOSIS — E782 Mixed hyperlipidemia: Secondary | ICD-10-CM | POA: Diagnosis not present

## 2018-04-29 DIAGNOSIS — R05 Cough: Secondary | ICD-10-CM | POA: Diagnosis not present

## 2018-04-29 DIAGNOSIS — J069 Acute upper respiratory infection, unspecified: Secondary | ICD-10-CM | POA: Diagnosis not present

## 2018-04-29 DIAGNOSIS — R0602 Shortness of breath: Secondary | ICD-10-CM | POA: Diagnosis not present

## 2018-05-06 DIAGNOSIS — J3489 Other specified disorders of nose and nasal sinuses: Secondary | ICD-10-CM | POA: Diagnosis not present

## 2018-05-06 DIAGNOSIS — J988 Other specified respiratory disorders: Secondary | ICD-10-CM | POA: Diagnosis not present

## 2018-05-06 DIAGNOSIS — I10 Essential (primary) hypertension: Secondary | ICD-10-CM | POA: Diagnosis not present

## 2018-05-20 DIAGNOSIS — R0602 Shortness of breath: Secondary | ICD-10-CM | POA: Diagnosis not present

## 2018-05-20 DIAGNOSIS — R0982 Postnasal drip: Secondary | ICD-10-CM | POA: Diagnosis not present

## 2018-05-20 DIAGNOSIS — J309 Allergic rhinitis, unspecified: Secondary | ICD-10-CM | POA: Diagnosis not present

## 2018-05-20 DIAGNOSIS — J988 Other specified respiratory disorders: Secondary | ICD-10-CM | POA: Diagnosis not present

## 2018-07-02 DIAGNOSIS — J22 Unspecified acute lower respiratory infection: Secondary | ICD-10-CM | POA: Diagnosis not present

## 2018-07-04 DIAGNOSIS — J22 Unspecified acute lower respiratory infection: Secondary | ICD-10-CM | POA: Diagnosis not present

## 2018-07-12 DIAGNOSIS — J45909 Unspecified asthma, uncomplicated: Secondary | ICD-10-CM | POA: Diagnosis not present

## 2021-12-29 ENCOUNTER — Other Ambulatory Visit: Payer: Self-pay

## 2021-12-29 ENCOUNTER — Emergency Department (HOSPITAL_COMMUNITY)
Admission: EM | Admit: 2021-12-29 | Discharge: 2021-12-29 | Disposition: A | Discharge status: Home / Self Care | Payer: Medicare Other | Attending: Emergency Medicine | Admitting: Emergency Medicine

## 2021-12-29 ENCOUNTER — Emergency Department (HOSPITAL_COMMUNITY): Payer: Medicare Other

## 2021-12-29 ENCOUNTER — Encounter (HOSPITAL_COMMUNITY): Payer: Self-pay

## 2021-12-29 DIAGNOSIS — J189 Pneumonia, unspecified organism: Secondary | ICD-10-CM | POA: Insufficient documentation

## 2021-12-29 DIAGNOSIS — R0602 Shortness of breath: Secondary | ICD-10-CM | POA: Diagnosis present

## 2021-12-29 DIAGNOSIS — Z20822 Contact with and (suspected) exposure to covid-19: Secondary | ICD-10-CM | POA: Insufficient documentation

## 2021-12-29 LAB — COMPREHENSIVE METABOLIC PANEL
ALT: 33 U/L (ref 0–44)
AST: 27 U/L (ref 15–41)
Albumin: 3.5 g/dL (ref 3.5–5.0)
Alkaline Phosphatase: 115 U/L (ref 38–126)
Anion gap: 10 (ref 5–15)
BUN: 16 mg/dL (ref 8–23)
CO2: 26 mmol/L (ref 22–32)
Calcium: 9.1 mg/dL (ref 8.9–10.3)
Chloride: 100 mmol/L (ref 98–111)
Creatinine, Ser: 0.94 mg/dL (ref 0.44–1.00)
GFR, Estimated: >60 mL/min (ref >60)
Glucose, Bld: 158 mg/dL — ABNORMAL HIGH (ref 70–99)
Potassium: 4.3 mmol/L (ref 3.5–5.1)
Sodium: 136 mmol/L (ref 135–145)
Total Bilirubin: 0.8 mg/dL (ref 0.3–1.2)
Total Protein: 8.2 g/dL — ABNORMAL HIGH (ref 6.5–8.1)

## 2021-12-29 LAB — CBC WITH DIFFERENTIAL/PLATELET
Abs Immature Granulocytes: 0.1 10*3/uL — ABNORMAL HIGH (ref 0.00–0.07)
Basophils Absolute: 0.1 10*3/uL (ref 0.0–0.1)
Basophils Relative: 1 %
Eosinophils Absolute: 0.1 10*3/uL (ref 0.0–0.5)
Eosinophils Relative: 1 %
HCT: 42.1 % (ref 36.0–46.0)
Hemoglobin: 14.1 g/dL (ref 12.0–15.0)
Immature Granulocytes: 1 %
Lymphocytes Relative: 26 %
Lymphs Abs: 2.7 10*3/uL (ref 0.7–4.0)
MCH: 30.4 pg (ref 26.0–34.0)
MCHC: 33.5 g/dL (ref 30.0–36.0)
MCV: 90.7 fL (ref 80.0–100.0)
Monocytes Absolute: 1 10*3/uL (ref 0.1–1.0)
Monocytes Relative: 9 %
Neutro Abs: 6.6 10*3/uL (ref 1.7–7.7)
Neutrophils Relative %: 62 %
Platelets: 401 10*3/uL — ABNORMAL HIGH (ref 150–400)
RBC: 4.64 MIL/uL (ref 3.87–5.11)
RDW: 12.3 % (ref 11.5–15.5)
WBC: 10.6 10*3/uL — ABNORMAL HIGH (ref 4.0–10.5)
nRBC: 0 % (ref 0.0–0.2)

## 2021-12-29 LAB — RESP PANEL BY RT-PCR (FLU A&B, COVID) ARPGX2
Influenza A by PCR: NEGATIVE
Influenza B by PCR: NEGATIVE
SARS Coronavirus 2 by RT PCR: NEGATIVE

## 2021-12-29 MED ORDER — DOXYCYCLINE HYCLATE 100 MG PO CAPS: 100.0000 mg | ORAL_CAPSULE | Freq: Two times a day (BID) | ORAL | 0 refills | Status: AC

## 2021-12-29 MED ORDER — CEFUROXIME AXETIL 500 MG PO TABS: 500.0000 mg | ORAL_TABLET | Freq: Two times a day (BID) | ORAL | 0 refills | Status: AC

## 2021-12-29 NOTE — ED Provider Notes (Signed)
?[facility] DEPT ?Provider Note ? ? ?CSN: [id number] ?Arrival date & time: 12/29/21  1716 ? ?  ? ?History ? ?Chief Complaint  ?Patient presents with  ? Pneumonia  ? ? ?Margaret Rowe is a 76 y.o. female. ? ? ?Pneumonia ?Associated symptoms include shortness of breath. Patient presents with pneumonia.  Seen in urgent care yesterday and diagnosed with pneumonia.  Has had cough chills nausea vomiting at times over the last almost 2 weeks.  States has been having breathing issues since she had COVID around 2 years ago.  States potentially is feeling better than before.  Is had a cough with occasional sputum production.  States that however after she took the Levaquin she was prescribed she began to vomit.  Still having some shortness of breath.  Brought paperwork with her and the read of the chest x-ray showed bilateral pneumonia.  Had recently finished up a Z-Pak also. ? ?  ? ?Home Medications ?Prior to Admission medications   ?Medication Sig Start Date End Date Taking? Authorizing Provider  ?cefUROXime (CEFTIN) 500 MG tablet Take 1 tablet (500 mg total) by mouth 2 (two) times daily with a meal. 12/29/21  Yes Davonna Belling, MD  ?doxycycline (VIBRAMYCIN) 100 MG capsule Take 1 capsule (100 mg total) by mouth 2 (two) times daily. 12/29/21  Yes Davonna Belling, MD  ?amLODipine (NORVASC) 5 MG tablet Take 5 mg by mouth daily.    [provider]  ?aspirin 81 MG tablet Take 81 mg by mouth daily.    [provider]  ?Omega-3 Fatty Acids (FISH OIL) 1000 MG CAPS Take 1 capsule by mouth.    [provider]  ?oxybutynin (DITROPAN-XL) 5 MG 24 hr tablet Take 1 tablet by mouth daily. 07/03/13   [provider]  ?pantoprazole (PROTONIX) 40 MG tablet Take 40 mg by mouth daily.    [provider]  ?predniSONE (DELTASONE) 20 MG tablet Take 20 mg by mouth daily.    [provider]  ?PREMARIN vaginal cream Place 1 Applicatorful vaginally daily. 07/03/13    [provider]  ?   ? ?Allergies    ?Augmentin [amoxicillin-pot clavulanate], Codeine, Compazine [prochlorperazine edisylate], Demerol [meperidine], Erythromycin, Lisinopril, and Toprol xl [metoprolol tartrate]   ? ?Review of Systems   ?Review of Systems  ?Constitutional:  Positive for appetite change.  ?HENT:  Negative for congestion.   ?Respiratory:  Positive for cough, shortness of breath and wheezing.   ?Gastrointestinal:  Positive for nausea and vomiting.  ?Genitourinary:  Negative for flank pain.  ?Neurological:  Negative for weakness.  ? ?Physical Exam ?Updated Vital Signs ?BP (!) 154/96   Pulse (!) 102   Temp 97.9 ?F (36.6 ?C) (Oral)   Resp 15   SpO2 92%  ?Physical Exam ?Vitals and nursing note reviewed.  ?Constitutional:   ?   Appearance: Normal appearance.  ?Eyes:  ?   Pupils: Pupils are equal, round, and reactive to light.  ?Cardiovascular:  ?   Rate and Rhythm: Regular rhythm. Tachycardia present.  ?Pulmonary:  ?   Breath sounds: Wheezing present.  ?Abdominal:  ?   Tenderness: There is no abdominal tenderness.  ?Musculoskeletal:     ?   General: No tenderness.  ?   Cervical back: Neck supple.  ?Skin: ?   General: Skin is warm.  ?   Capillary Refill: Capillary refill takes less than 2 seconds.  ?Neurological:  ?   Mental Status: She is alert and oriented to person, place, and  time.  ? ? ?ED Results / Procedures / Treatments   ?Labs ?(all labs ordered are listed, but only abnormal results are displayed) ?Labs Reviewed  ?CBC WITH DIFFERENTIAL/PLATELET - Abnormal; Notable for the following components:  ?    Result Value  ? WBC 10.6 (*)   ? Platelets 401 (*)   ? Abs Immature Granulocytes 0.10 (*)   ? All other components within normal limits  ?COMPREHENSIVE METABOLIC PANEL - Abnormal; Notable for the following components:  ? Glucose, Bld 158 (*)   ? Total Protein 8.2 (*)   ? All other components within normal limits  ?RESP PANEL BY RT-PCR (FLU A&B, COVID) [id number]  ? ? ?EKG ?None ? ?Radiology ?DG  Chest Portable 1 View ? ?Result Date: 12/29/2021 ?CLINICAL DATA:  Shortness of breath EXAM: PORTABLE CHEST 1 VIEW COMPARISON:  04/29/2018, 10/06/2019 report FINDINGS: Airspace disease at the right greater than left lung base. No pleural effusion. Normal cardiomediastinal silhouette with aortic atherosclerosis. IMPRESSION: Mild right greater than left basilar airspace disease, potential pneumonia. Radiographic follow-up to resolution is recommended Electronically Signed   By: Donavan Foil M.D.   On: 12/29/2021 19:02   ? ?Procedures ?Procedures  ? ? ?Medications Ordered in ED ?Medications - No data to display ? ?ED Course/ Medical Decision Making/ A&P ?  ?                        ?Medical Decision Making ?Amount and/or Complexity of Data Reviewed ?Labs: ordered. ?Radiology: ordered. ? ?Risk ?Prescription drug management. ? ? ?Patient presents with shortness of breath cough.  Has been feeling bad for a while now.  Has been on a Z-Pak and just started on Levaquin for pneumonia on x-ray at urgent care.  However started to vomit after the Levaquin.  States she still feels bad.  Likely has a COPD or chronic lung issues after COVID.  Chest x-ray independently interpreted and showed pneumonia.  Not hypoxic and has been able to ambulate.  Initial hypertension has improved.  Patient is eager to go home and I think she is a good candidate for a trial at home.  Discussed with pharmacy and reportedly has penicillin allergy but is just nausea.  We will do doxycycline and cefuroxime.  Outpatient follow-up as needed.  Will discharge home. ?I reviewed the paperwork from the urgent care that came with the patient ? ? ? ? ? ? ? ?Final Clinical Impression(s) / ED Diagnoses ?Final diagnoses:  ?Community acquired pneumonia, unspecified laterality  ? ? ?Rx / DC Orders ?ED Discharge Orders   ? ?      Ordered  ?  doxycycline (VIBRAMYCIN) 100 MG capsule  2 times daily       ? 12/29/21 2158  ?  cefUROXime (CEFTIN) 500 MG tablet  2 times daily  with meals       ? 12/29/21 2158  ? ?  ?  ? ?  ? ? ?  ?Davonna Belling, MD ?12/29/21 2318 ? ?

## 2021-12-29 NOTE — ED Triage Notes (Signed)
Pt was seen at [facility] yesterday and diagnosed with bilateral pneumonia. Pt was prescribed moxifloxacin and reports vomiting shortly after taking today. Pt reports continued SHoB and fatigue.  ?

## 2021-12-29 NOTE — ED Notes (Signed)
Pt ambulated with pulse ox, oxygen remained above 93% on RA ?

## 2023-03-27 IMAGING — DX DG CHEST 1V PORT
1 series · 1 of 1 positions shown · non-contrast
Comparison: 04/29/2018, 10/06/2019 report

CLINICAL DATA: Shortness of breath

EXAM:
PORTABLE CHEST 1 VIEW

[chest ap]
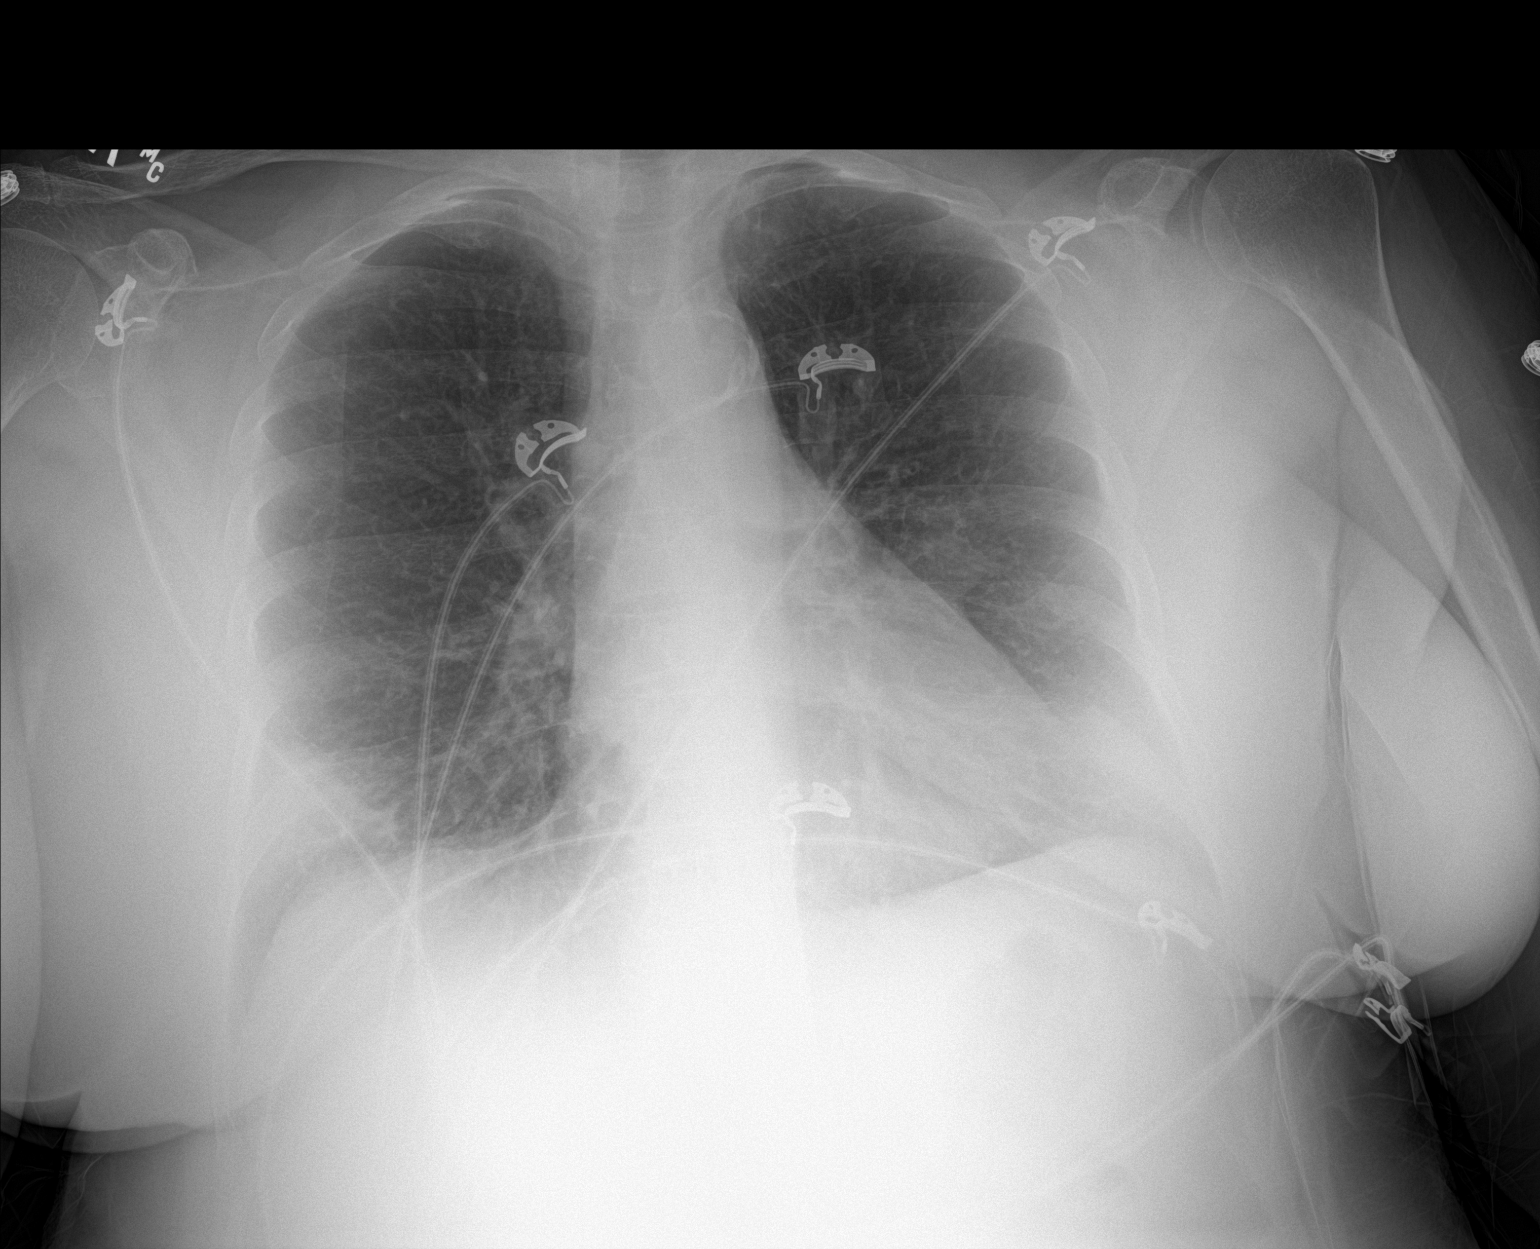

[1 of 1 positions shown; findings below may reference images not displayed]

FINDINGS: Airspace disease at the right greater than left lung base. No
pleural effusion. Normal cardiomediastinal silhouette with aortic
atherosclerosis.
IMPRESSION: Mild right greater than left basilar airspace disease, potential
pneumonia. Radiographic follow-up to resolution is recommended
